# Patient Record
Sex: Male | Born: 1985 | Race: Black or African American | Hispanic: No | Marital: Single
Health system: Southern US, Community
[De-identification: ages and names within clinical notes are randomized; demographics above are authoritative.]

---

## 2018-09-17 ENCOUNTER — Other Ambulatory Visit: Payer: Self-pay

## 2018-09-17 ENCOUNTER — Emergency Department (HOSPITAL_COMMUNITY)
Admission: EM | Admit: 2018-09-17 | Discharge: 2018-09-18 | Disposition: A | Discharge status: Home / Self Care | Payer: Managed Care, Other (non HMO) | Attending: Emergency Medicine | Admitting: Emergency Medicine

## 2018-09-17 DIAGNOSIS — Y92410 Unspecified street and highway as the place of occurrence of the external cause: Secondary | ICD-10-CM | POA: Insufficient documentation

## 2018-09-17 DIAGNOSIS — Y998 Other external cause status: Secondary | ICD-10-CM | POA: Diagnosis not present

## 2018-09-17 DIAGNOSIS — Y9389 Activity, other specified: Secondary | ICD-10-CM | POA: Insufficient documentation

## 2018-09-17 DIAGNOSIS — S86912A Strain of unspecified muscle(s) and tendon(s) at lower leg level, left leg, initial encounter: Secondary | ICD-10-CM | POA: Insufficient documentation

## 2018-09-17 DIAGNOSIS — S8992XA Unspecified injury of left lower leg, initial encounter: Secondary | ICD-10-CM | POA: Diagnosis present

## 2018-09-17 DIAGNOSIS — W2210XA Striking against or struck by unspecified automobile airbag, initial encounter: Secondary | ICD-10-CM | POA: Diagnosis not present

## 2018-09-17 MED ORDER — ACETAMINOPHEN 500 MG PO TABS
1000.0000 mg | ORAL_TABLET | Freq: Once | ORAL | Status: AC
  Administered 2018-09-18: 1000 mg via ORAL
  Filled 2018-09-17: qty 2
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
## 2018-09-17 NOTE — ED Notes (Signed)
Sean Hacker Jenne- [phone] for updates

## 2018-09-18 ENCOUNTER — Emergency Department (HOSPITAL_COMMUNITY): Payer: Managed Care, Other (non HMO)

## 2018-09-18 NOTE — Discharge Instructions (Signed)
You may alternate Tylenol 1000 mg every 6 hours as needed for pain and Ibuprofen 800 mg every 8 hours as needed for pain.  Please take Ibuprofen with food. ° °

## 2018-09-18 NOTE — ED Notes (Signed)
Patient transported to X-ray 

## 2018-09-18 NOTE — ED Notes (Signed)
Pt able to tolerate cup of water with no difficulty.

## 2018-09-18 NOTE — ED Notes (Signed)
Pt was able to ambulate from bed to outside of room and back with some assitance. Could tolerate putting some weight on left leg.
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
